# Patient Record
Sex: Female | Born: 2000 | Race: White | Hispanic: No | Marital: Single | Smoking: Never smoker
Health system: Southern US, Community
[De-identification: ages and names within clinical notes are randomized; demographics above are authoritative.]

---
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
## 2022-07-13 IMAGING — CT CT RENAL STONE PROTOCOL
3 of 4 series · 9 of 46 positions shown, 16 images · non-contrast
Comparison: None.

CLINICAL DATA: Left flank pain.

EXAM:
CT ABDOMEN AND PELVIS WITHOUT CONTRAST
TECHNIQUE: Multidetector CT imaging of the abdomen and pelvis was performed
following the standard protocol without IV contrast.

[Series 4: lung bases · axial · 0.74mm/px · z∈[-592,-512]mm · 5 of 26 slices shown, 10 images]
[im 5/26  soft-tissue]
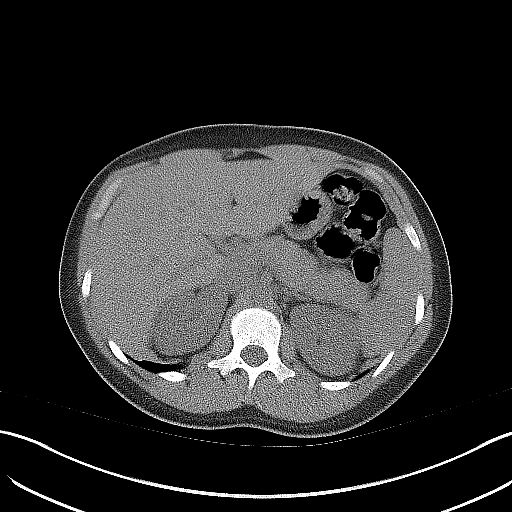
[im 5/26  bone]
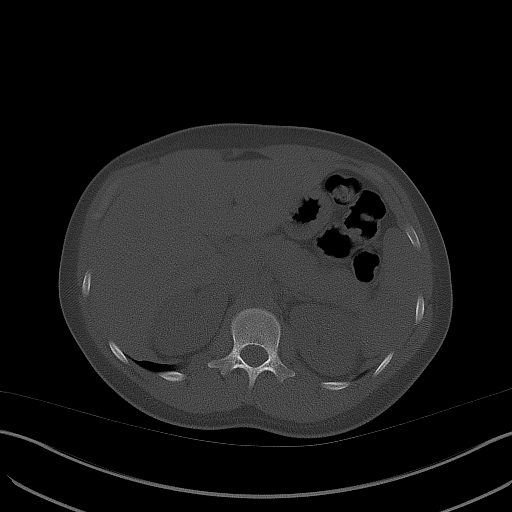
[im 9/26  soft-tissue]
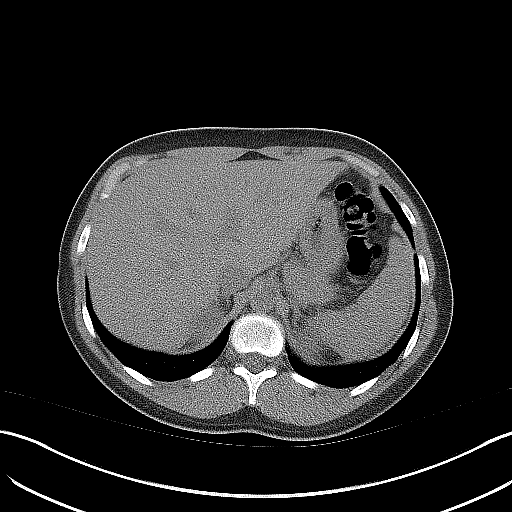
[im 9/26  lung]
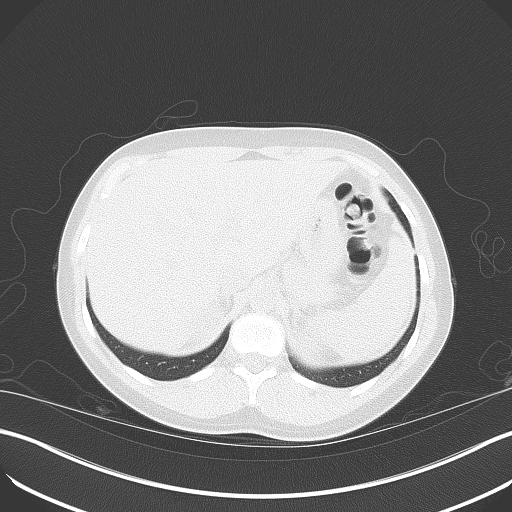
[im 13/26  soft-tissue]
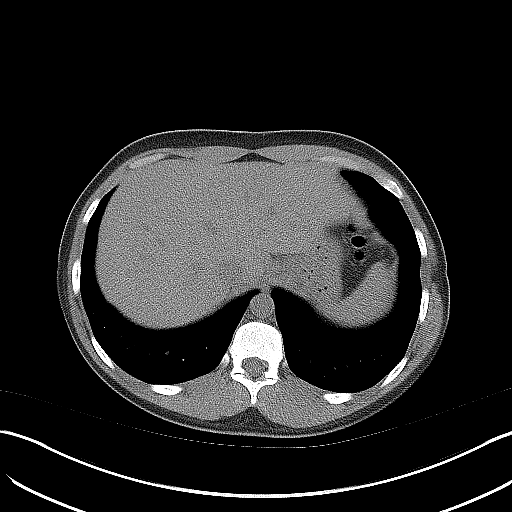
[im 13/26  lung]
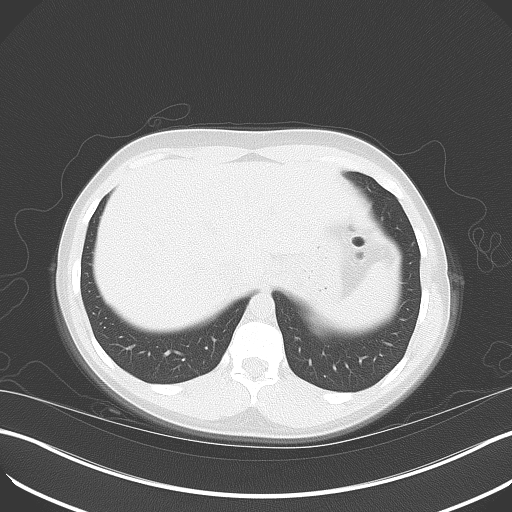
[im 17/26  soft-tissue]
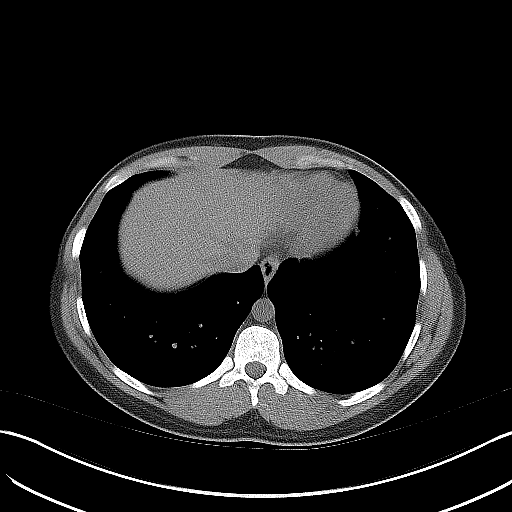
[im 17/26  lung]
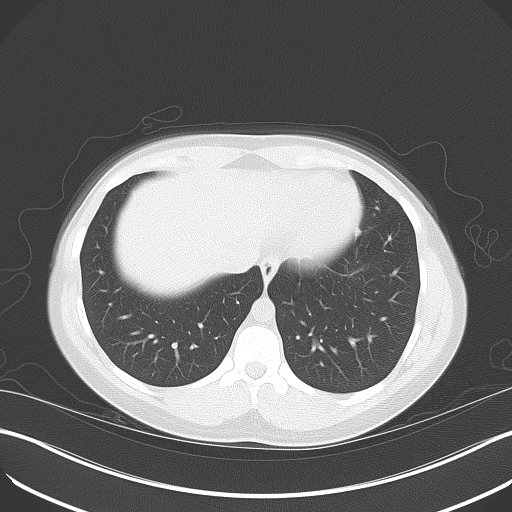
[im 21/26  soft-tissue]
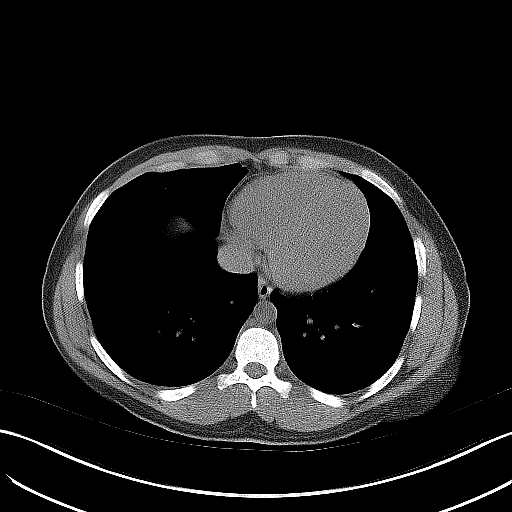
[im 21/26  lung]
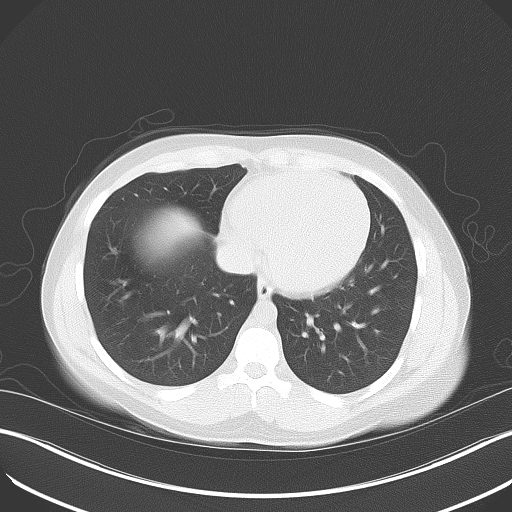

[Series 5: coronal · coronal · 0.72mm/px · 3 of 129 slices shown, 4 images]
[im 43/129  soft-tissue]
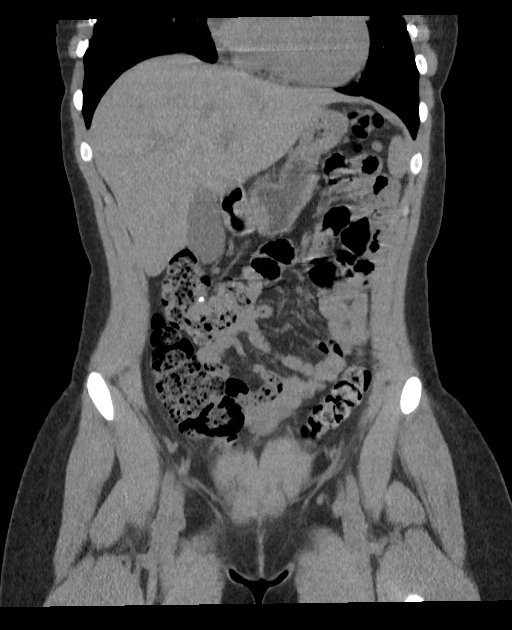
[im 57/129  soft-tissue]
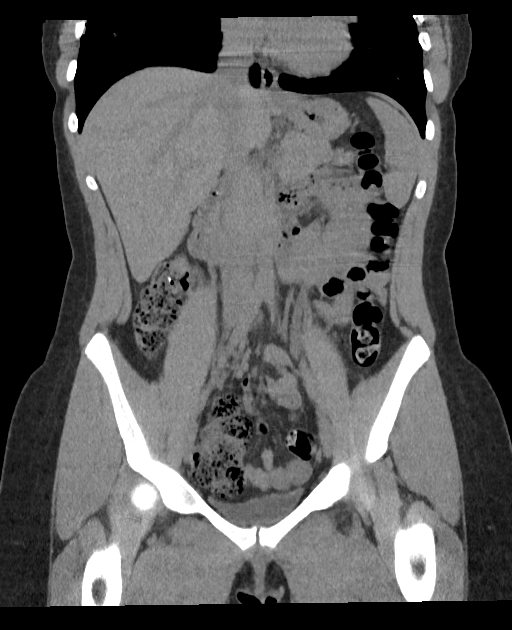
[im 57/129  bone]
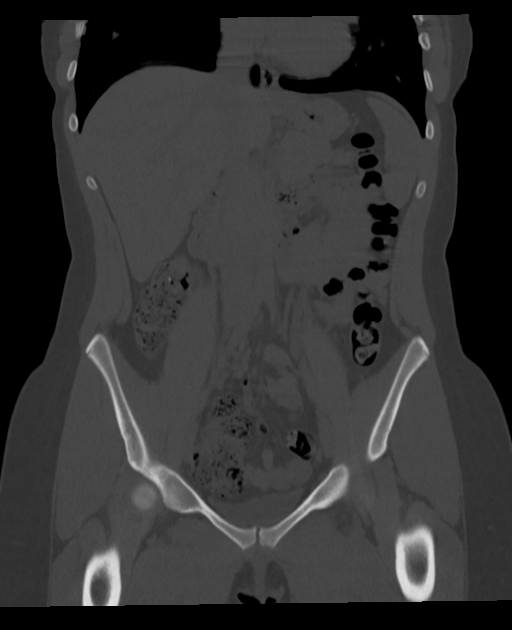
[im 72/129  soft-tissue]
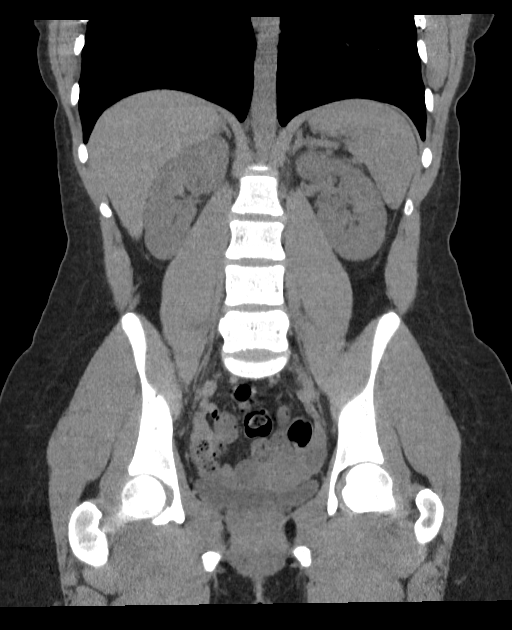

[Series 6: sagittal · sagittal · 0.55mm/px · 1 of 162 slices shown, 2 images]
[im 54/162  soft-tissue]
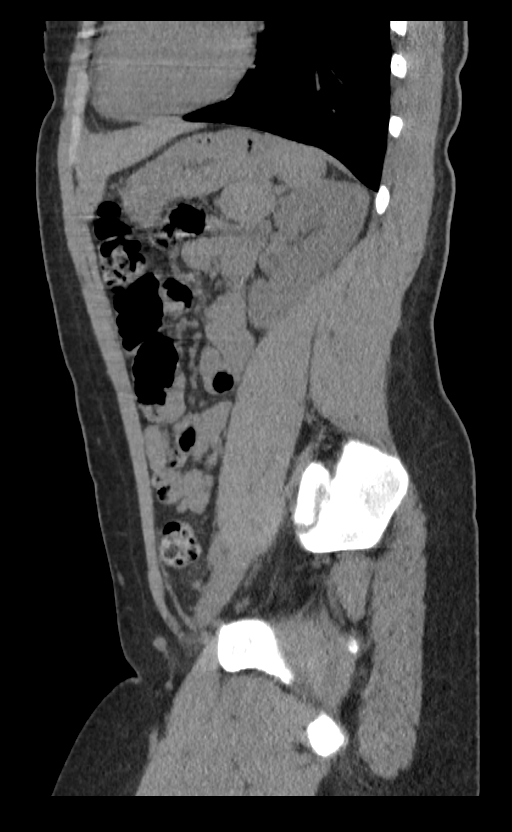
[im 54/162  bone]
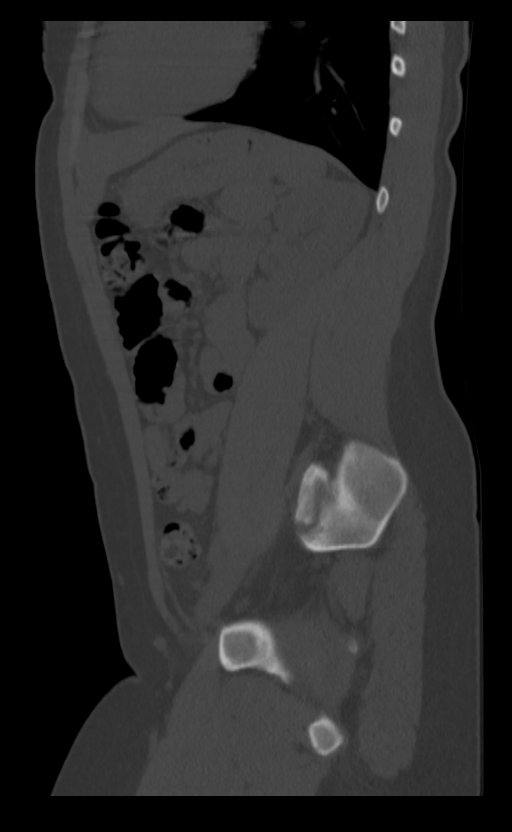

[9 of 46 positions shown; findings below may reference images not displayed]

FINDINGS: Lower chest: No acute abnormality.

Hepatobiliary: No focal liver abnormality is seen. No gallstones,
gallbladder wall thickening, or biliary dilatation.

Pancreas: Unremarkable. No pancreatic ductal dilatation or
surrounding inflammatory changes.

Spleen: Normal in size without focal abnormality.

Adrenals/Urinary Tract: Adrenal glands appear normal. Minimal left
hydroureteronephrosis is noted secondary to 3 mm calculus at the
left ureterovesical junction. Right kidney and ureter are
unremarkable. Urinary bladder is unremarkable.

Stomach/Bowel: Stomach is within normal limits. Appendix appears
normal. No evidence of bowel wall thickening, distention, or
inflammatory changes.

Vascular/Lymphatic: No significant vascular findings are present. No
enlarged abdominal or pelvic lymph nodes.

Reproductive: Uterus and bilateral adnexa are unremarkable.

Other: No abdominal wall hernia or abnormality. No abdominopelvic
ascites.

Musculoskeletal: No acute or significant osseous findings.
IMPRESSION: Minimal left hydroureteronephrosis is noted secondary to 3 mm
calculus at the left ureterovesical junction.
